# Patient Record
Sex: Male | Born: 1965 | Race: White | Hispanic: No | Marital: Married | Smoking: Never smoker
Health system: Southern US, Community
[De-identification: ages and names within clinical notes are randomized; demographics above are authoritative.]

## PROBLEM LIST (undated history)

## (undated) DIAGNOSIS — L409 Psoriasis, unspecified: Secondary | ICD-10-CM

## (undated) HISTORY — PX: HERNIA REPAIR: SHX51

## (undated) HISTORY — DX: Psoriasis, unspecified: L40.9

---

## 2007-01-03 ENCOUNTER — Ambulatory Visit: Payer: Self-pay | Admitting: Family Medicine

## 2011-03-24 ENCOUNTER — Encounter: Payer: Self-pay | Admitting: Family Medicine

## 2011-03-24 ENCOUNTER — Ambulatory Visit (INDEPENDENT_AMBULATORY_CARE_PROVIDER_SITE_OTHER): Payer: BC Managed Care – PPO | Admitting: Family Medicine

## 2011-03-24 VITALS — BP 123/73 | HR 67 | Temp 98.6°F | Ht 70.0 in | Wt 172.0 lb

## 2011-03-24 DIAGNOSIS — R053 Chronic cough: Secondary | ICD-10-CM

## 2011-03-24 DIAGNOSIS — R05 Cough: Secondary | ICD-10-CM

## 2011-03-24 DIAGNOSIS — J0100 Acute maxillary sinusitis, unspecified: Secondary | ICD-10-CM

## 2011-03-24 MED ORDER — HYDROCOD POLST-CHLORPHEN POLST 10-8 MG/5ML PO LQCR: 5.0000 mL | Freq: Two times a day (BID) | ORAL | Status: DC | PRN

## 2011-03-24 MED ORDER — AMOXICILLIN-POT CLAVULANATE 875-125 MG PO TABS: 1.0000 | ORAL_TABLET | Freq: Two times a day (BID) | ORAL | Status: AC

## 2011-03-24 MED ORDER — FLUTICASONE PROPIONATE 50 MCG/ACT NA SUSP: 2.0000 | Freq: Every day | NASAL | Status: DC

## 2011-03-24 MED ORDER — FEXOFENADINE-PSEUDOEPHED ER 180-240 MG PO TB24: 1.0000 | ORAL_TABLET | Freq: Every day | ORAL | Status: AC

## 2011-03-24 NOTE — Progress Notes (Signed)
Subjective:     Patient ID: Zachary Pearson, male   DOB: [birth year], 46 y.o.   MRN: [id number]  Sinusitis This is a new problem. The current episode started 1 to 4 weeks ago. The problem has been gradually worsening since onset. There has been no fever. Associated symptoms include chills, coughing, diaphoresis, a hoarse voice, sneezing and a sore throat. Pertinent negatives include no congestion, ear pain, headaches, neck pain, shortness of breath, sinus pressure or swollen glands. The treatment provided no relief.  Cough This is a new problem. The current episode started 1 to 4 weeks ago. The problem has been gradually worsening. The problem occurs constantly. The cough is non-productive. Associated symptoms include chills and a sore throat. Pertinent negatives include no ear pain, headaches or shortness of breath. The symptoms are aggravated by lying down. He has tried OTC cough suppressant for the symptoms. The treatment provided no relief. There is no history of asthma, bronchitis, COPD, emphysema or pneumonia.     Review of Systems  Constitutional: Positive for chills and diaphoresis.  HENT: Positive for sore throat, hoarse voice and sneezing. Negative for ear pain, congestion, neck pain and sinus pressure.   Respiratory: Positive for cough. Negative for shortness of breath.   Neurological: Negative for headaches.  All other systems reviewed and are negative.       Objective:   Physical Exam  Nursing note and vitals reviewed. Constitutional: He is oriented to person, place, and time. He appears well-developed and well-nourished. No distress.  HENT:  Head: Normocephalic and atraumatic.  Right Ear: Tympanic membrane, external ear and ear canal normal.  Left Ear: Tympanic membrane, external ear and ear canal normal.  Nose: Mucosal edema and rhinorrhea present. Right sinus exhibits maxillary sinus tenderness. Left sinus exhibits maxillary sinus tenderness.  Mouth/Throat: Oropharynx is clear  and moist. No oral lesions. No oropharyngeal exudate.  Eyes: Right eye exhibits no discharge. Left eye exhibits no discharge. No scleral icterus.  Neck: Neck supple. No tracheal deviation present. No thyromegaly present.  Cardiovascular: Normal rate, regular rhythm and normal heart sounds.   Pulmonary/Chest: Effort normal and breath sounds normal. He has no wheezes. He has no rales.  Lymphadenopathy:    He has no cervical adenopathy.  Neurological: He is alert and oriented to person, place, and time.  Skin: Skin is warm and dry.       Assessment:     Sinusitis     Plan:     Augmentin Allegra D  flonase  tussionex

## 2011-03-24 NOTE — Patient Instructions (Signed)

## 2011-05-06 ENCOUNTER — Encounter: Payer: Self-pay | Admitting: *Deleted

## 2011-05-13 ENCOUNTER — Encounter: Payer: BC Managed Care – PPO | Admitting: Physician Assistant

## 2011-05-13 DIAGNOSIS — Z0289 Encounter for other administrative examinations: Secondary | ICD-10-CM

## 2012-06-08 ENCOUNTER — Ambulatory Visit (INDEPENDENT_AMBULATORY_CARE_PROVIDER_SITE_OTHER): Payer: BC Managed Care – PPO | Admitting: Family Medicine

## 2012-06-08 ENCOUNTER — Encounter: Payer: Self-pay | Admitting: Family Medicine

## 2012-06-08 VITALS — BP 123/77 | HR 86 | Ht 68.5 in | Wt 185.0 lb

## 2012-06-08 DIAGNOSIS — N529 Male erectile dysfunction, unspecified: Secondary | ICD-10-CM | POA: Insufficient documentation

## 2012-06-08 DIAGNOSIS — R5383 Other fatigue: Secondary | ICD-10-CM | POA: Insufficient documentation

## 2012-06-08 DIAGNOSIS — Z131 Encounter for screening for diabetes mellitus: Secondary | ICD-10-CM

## 2012-06-08 DIAGNOSIS — L408 Other psoriasis: Secondary | ICD-10-CM

## 2012-06-08 DIAGNOSIS — L409 Psoriasis, unspecified: Secondary | ICD-10-CM | POA: Insufficient documentation

## 2012-06-08 DIAGNOSIS — R5381 Other malaise: Secondary | ICD-10-CM

## 2012-06-08 DIAGNOSIS — Z13220 Encounter for screening for lipoid disorders: Secondary | ICD-10-CM

## 2012-06-08 HISTORY — DX: Psoriasis, unspecified: L40.9

## 2012-06-08 MED ORDER — SILDENAFIL CITRATE 100 MG PO TABS: 100.0000 mg | ORAL_TABLET | ORAL | Status: DC | PRN

## 2012-06-08 NOTE — Progress Notes (Signed)
CC: Zachary Pearson is a 47 y.o. male is here for would like blood work   Subjective: HPI:  Pleasant 47 year old complains of fatigue. This has been present for about 6 months. Has not been getting better or worse since gradual onset. Described as moderate in severity. States that he wakes up full energy which persists until around 6:00 when he gets home and only wants to do is sit around until he goes to bed at 9:00. He reports getting 6-7 hours of sleep on a nightly basis. Denies fatigue during the day. Denies shortness of breath at rest or with exertion. he has never had this before. No interventions as of yet. Nothing makes it better or worse. He's not quite sure if days that he is not at work are better or not as he works 6-7 days a week.  Patient complains of satisfactory libido however trouble initiating erection. Still has random nocturnal erections. Is in a loving relationship and still attracted to his wife of 9 years. He denies exertional chest pain or limb claudication. He denies dysuria, nor penile discharge.  History psoriasis. He is requesting referral to dermatology Denies fevers, chills, shortness of breath, irregular heartbeat, motor or sensory disturbances, alcohol use, recreational drug use, abdominal pain, decreased appetite, diarrhea, constipation, nausea, nor abdominal pain    Review Of Systems Outlined In HPI  Past Medical History  Diagnosis Date  . Psoriasis 06/08/2012     Family History  Problem Relation Age of Onset  . Cancer Father     lung  . Heart disease Paternal Grandfather      History  Substance Use Topics  . Smoking status: Never Smoker   . Smokeless tobacco: Not on file  . Alcohol Use: No     Objective: Filed Vitals:   06/08/12 1510  BP: 123/77  Pulse: 86    General: Alert and Oriented, No Acute Distress HEENT: Pupils equal, round, reactive to light. Conjunctivae clear.  Moist mucous membranes Lungs: Clear to auscultation bilaterally, no  wheezing/ronchi/rales.  Comfortable work of breathing. Good air movement. Cardiac: Regular rate and rhythm. Normal S1/S2.  No murmurs, rubs, nor gallops.   Abdomen: Soft nontender to palpation Extremities: No peripheral edema.  Strong peripheral pulses.  Mental Status: No depression, anxiety, nor agitation. Skin: Warm and dry. Erythematous and scaly plaques on the left knee and right elbow  Assessment & Plan: Eston was seen today for would like blood work.  Diagnoses and associated orders for this visit:  Fatigue - TSH - B12 - Vitamin D 25 hydroxy - Testosterone, free, total - COMPLETE METABOLIC PANEL WITH GFR - CBC  Lipid screening - Lipid panel  Diabetes mellitus screening - COMPLETE METABOLIC PANEL WITH GFR  Erectile dysfunction - sildenafil (VIAGRA) 100 MG tablet; Take 1 tablet (100 mg total) by mouth as needed for erectile dysfunction. Take 45 minutes prior to activity.  Psoriasis - Ambulatory referral to Dermatology    Fatigue: He will ask his wife about apnea episodes as he is a snorer. Ruling out reversible causes as above, will have this done first thing in the morning to check for testosterone. Lipid screening: He has never been screened for dyslipidemia we will use this blood draw as an opportunity Erectile dysfunction: Samples given of Viagra Psoriasis: Uncontrolled, referral to dermatology   Return in about 3 months (around 09/08/2012).

## 2012-06-25 LAB — CBC
HCT: 43.1 % (ref 39.0–52.0)
Hemoglobin: 15.5 g/dL (ref 13.0–17.0)
MCH: 31.8 pg (ref 26.0–34.0)
MCHC: 36 g/dL (ref 30.0–36.0)
MCV: 88.5 fL (ref 78.0–100.0)

## 2012-06-25 LAB — LIPID PANEL
LDL Cholesterol: 188 mg/dL — ABNORMAL HIGH (ref 0–99)
VLDL: 26 mg/dL (ref 0–40)

## 2012-06-25 LAB — COMPLETE METABOLIC PANEL WITH GFR
ALT: 64 U/L — ABNORMAL HIGH (ref 0–53)
Alkaline Phosphatase: 40 U/L (ref 39–117)
Potassium: 4.5 mEq/L (ref 3.5–5.3)
Sodium: 139 mEq/L (ref 135–145)
Total Bilirubin: 0.6 mg/dL (ref 0.3–1.2)
Total Protein: 7 g/dL (ref 6.0–8.3)

## 2012-06-26 ENCOUNTER — Telehealth: Payer: Self-pay | Admitting: Family Medicine

## 2012-06-26 DIAGNOSIS — E785 Hyperlipidemia, unspecified: Secondary | ICD-10-CM

## 2012-06-26 DIAGNOSIS — R748 Abnormal levels of other serum enzymes: Secondary | ICD-10-CM | POA: Insufficient documentation

## 2012-06-26 LAB — TESTOSTERONE, FREE, TOTAL, SHBG
Sex Hormone Binding: 30 nmol/L (ref 13–71)
Testosterone-% Free: 2.2 % (ref 1.6–2.9)
Testosterone: 436 ng/dL (ref 300–890)

## 2012-06-26 MED ORDER — SIMVASTATIN 20 MG PO TABS: 20.0000 mg | ORAL_TABLET | Freq: Every evening | ORAL | Status: DC

## 2012-06-26 NOTE — Telephone Encounter (Signed)
Zachary Pearson, Will you please let Zachary Pearson know that his blood work did not show any abnormalities with respect to his blood cell counts, thyroid function, vitamin deficiency, nor testosterone deficiency.  I'd still like him to ask his wife to check on him while he sleeps to see if there's any snoring or stopping of breathing as this would suggest the possibility of sleep apnea which is a common cause of fatigue.  His LDL cholesterol was 188 which is well above the upper limit of 160, I would strongly encourage him to start a cholesterol medication called simvastatin which I have sent to his [facility] on file.  Please follow up with me in a few weeks to discus whether or not a sleep study would help rule out sleep apnea causing fatigue.

## 2012-06-27 NOTE — Telephone Encounter (Signed)
Left message with results on pt's vm 

## 2012-08-13 ENCOUNTER — Other Ambulatory Visit: Payer: Self-pay | Admitting: *Deleted

## 2012-08-13 DIAGNOSIS — E785 Hyperlipidemia, unspecified: Secondary | ICD-10-CM

## 2013-10-04 ENCOUNTER — Ambulatory Visit: Payer: BC Managed Care – PPO | Admitting: Sports Medicine

## 2013-10-18 ENCOUNTER — Ambulatory Visit: Payer: BC Managed Care – PPO | Admitting: Family Medicine

## 2013-10-23 ENCOUNTER — Encounter: Payer: Self-pay | Admitting: Family Medicine

## 2013-10-23 ENCOUNTER — Ambulatory Visit (INDEPENDENT_AMBULATORY_CARE_PROVIDER_SITE_OTHER): Payer: Managed Care, Other (non HMO) | Admitting: Family Medicine

## 2013-10-23 VITALS — BP 122/81 | HR 89 | Wt 192.0 lb

## 2013-10-23 DIAGNOSIS — M25569 Pain in unspecified knee: Secondary | ICD-10-CM

## 2013-10-23 DIAGNOSIS — M25561 Pain in right knee: Secondary | ICD-10-CM

## 2013-10-23 MED ORDER — MELOXICAM 15 MG PO TABS: 15.0000 mg | ORAL_TABLET | Freq: Every day | ORAL | Status: DC | PRN

## 2013-10-23 NOTE — Progress Notes (Signed)
CC: Lezlie LyeGeorge Martorana is a 48 y.o. male is here for right knee pain   Subjective: HPI:  Complains of right knee pain that is further described as a cracking sensation and grinding sensation has been present for the past 2 weeks. It is present on a daily basis and most notable and going up or downstairs. Since it occurred he had one episode of right knee pain was described as a sharpness deep in the knee after twisting it however this resolved within minutes and has not returned. He denies locking catching giving way. Denies redness swelling or warmth of the knee and denies recent or remote trauma. Denies fevers, chills, nor weakness in the extremity   Review Of Systems Outlined In HPI  Past Medical History  Diagnosis Date  . Psoriasis 06/08/2012    Past Surgical History  Procedure Laterality Date  . Hernia repair     Family History  Problem Relation Age of Onset  . Cancer Father     lung  . Heart disease Paternal Grandfather     History   Social History  . Marital Status: Married    Spouse Name: N/A    Number of Children: N/A  . Years of Education: N/A   Occupational History  . Not on file.   Social History Main Topics  . Smoking status: Never Smoker   . Smokeless tobacco: Not on file  . Alcohol Use: No  . Drug Use: No  . Sexual Activity: Not on file   Other Topics Concern  . Not on file   Social History Narrative  . No narrative on file     Objective: BP 122/81  Pulse 89  Wt 192 lb (87.091 kg)  Vital signs reviewed. General: Alert and Oriented, No Acute Distress HEENT: Pupils equal, round, reactive to light. Conjunctivae clear.  External ears unremarkable.  Moist mucous membranes. Lungs: Clear and comfortable work of breathing, speaking in full sentences without accessory muscle use. Cardiac: Regular rate and rhythm.  Right knee exam shows full-strength and range of motion. There is no swelling, redness, nor warmth overlying the knee.  No patellar crepitus. No  patellar apprehension. No pain with palpation of the inferior patellar pole.  No pain or laxity with valgus nor varus stress. Anterior drawer is negative. McMurray's negative. No popliteal space tenderness or palpable mass. No medial or lateral joint line tenderness to palpation. Extremities: No peripheral edema.  Strong peripheral pulses.  Mental Status: No depression, anxiety, nor agitation. Logical though process. Skin: Warm and dry.  Assessment & Plan: Greggory StallionGeorge was seen today for right knee pain.  Diagnoses and associated orders for this visit:  Right knee pain - meloxicam (MOBIC) 15 MG tablet; Take 1 tablet (15 mg total) by mouth daily as needed for pain.    Reassurance was provided that if anything he is experiencing mild osteoarthritis and that the auditory component of what is experiencing is benign shifting of synovial fluid. Start home exercise rehabilitation plan on a daily basis the next 3 weeks and as needed meloxicam for pain.   Return if symptoms worsen or fail to improve.

## 2014-10-08 ENCOUNTER — Ambulatory Visit (INDEPENDENT_AMBULATORY_CARE_PROVIDER_SITE_OTHER): Payer: Managed Care, Other (non HMO) | Admitting: Family Medicine

## 2014-10-08 ENCOUNTER — Encounter: Payer: Self-pay | Admitting: Family Medicine

## 2014-10-08 VITALS — BP 116/76 | HR 85 | Ht 70.0 in | Wt 187.0 lb

## 2014-10-08 DIAGNOSIS — L409 Psoriasis, unspecified: Secondary | ICD-10-CM | POA: Diagnosis not present

## 2014-10-08 DIAGNOSIS — Z79899 Other long term (current) drug therapy: Secondary | ICD-10-CM

## 2014-10-08 DIAGNOSIS — Z111 Encounter for screening for respiratory tuberculosis: Secondary | ICD-10-CM

## 2014-10-08 DIAGNOSIS — Z23 Encounter for immunization: Secondary | ICD-10-CM | POA: Diagnosis not present

## 2014-10-08 DIAGNOSIS — Z0000 Encounter for general adult medical examination without abnormal findings: Secondary | ICD-10-CM

## 2014-10-08 DIAGNOSIS — N529 Male erectile dysfunction, unspecified: Secondary | ICD-10-CM

## 2014-10-08 MED ORDER — TADALAFIL 20 MG PO TABS: 10.0000 mg | ORAL_TABLET | ORAL | Status: DC | PRN

## 2014-10-08 NOTE — Addendum Note (Signed)
Addended by: Laren Boom on: 10/08/2014 04:46 PM   Modules accepted: Orders

## 2014-10-08 NOTE — Progress Notes (Signed)
Requests samples for cialis

## 2014-10-08 NOTE — Addendum Note (Signed)
Addended by: Wyline Beady on: 10/08/2014 04:49 PM   Modules accepted: Orders

## 2014-10-08 NOTE — Progress Notes (Signed)
CC: Zachary Pearson is a 49 y.o. male is here for Annual Exam   Subjective: HPI:  Colonoscopy: No current indication Prostate: Discussed screening risks/beneifts with patient today, no current indication until 50   Influenza Vaccine: Up-to-date Pneumovax: No current indication Td/Tdap: Overdue for tetanus booster Zoster: (Start 47 yo)  Presents for complete physical exam requesting guidance on starting medication for psoriasis. He is interested in injectable medication such as Humira. He heard that he might need to be tested for some infections.  Review of Systems - General ROS: negative for - chills, fever, night sweats, weight gain or weight loss Ophthalmic ROS: negative for - decreased vision Psychological ROS: negative for - anxiety or depression ENT ROS: negative for - hearing change, nasal congestion, tinnitus or allergies Hematological and Lymphatic ROS: negative for - bleeding problems, bruising or swollen lymph nodes Breast ROS: negative Respiratory ROS: no cough, shortness of breath, or wheezing Cardiovascular ROS: no chest pain or dyspnea on exertion Gastrointestinal ROS: no abdominal pain, change in bowel habits, or black or bloody stools Genito-Urinary ROS: negative for - genital discharge, genital ulcers, incontinence or abnormal bleeding from genitals Musculoskeletal ROS: negative for - joint pain or muscle pain Neurological ROS: negative for - headaches or memory loss Dermatological ROS: negative for lumps, mole changes, rash and skin lesion changes other than psoriasis plaques involving the chin buttocks back thighs  Past Medical History  Diagnosis Date  . Psoriasis 06/08/2012    Past Surgical History  Procedure Laterality Date  . Hernia repair     Family History  Problem Relation Age of Onset  . Cancer Father     lung  . Heart disease Paternal Grandfather     History   Social History  . Marital Status: Married    Spouse Name: N/A  . Number of Children:  N/A  . Years of Education: N/A   Occupational History  . Not on file.   Social History Main Topics  . Smoking status: Never Smoker   . Smokeless tobacco: Not on file  . Alcohol Use: No  . Drug Use: No  . Sexual Activity: Not on file   Other Topics Concern  . Not on file   Social History Narrative     Objective: BP 116/76 mmHg  Pulse 85  Ht  (1.778 m)  Wt 187 lb (84.823 kg)  BMI 26.83 kg/m2  General: No Acute Distress HEENT: Atraumatic, normocephalic, conjunctivae normal without scleral icterus.  No nasal discharge, hearing grossly intact, TMs with good landmarks bilaterally with no middle ear abnormalities, posterior pharynx clear without oral lesions. Neck: Supple, trachea midline, no cervical nor supraclavicular adenopathy. Pulmonary: Clear to auscultation bilaterally without wheezing, rhonchi, nor rales. Cardiac: Regular rate and rhythm.  No murmurs, rubs, nor gallops. No peripheral edema.  2+ peripheral pulses bilaterally. Abdomen: Bowel sounds normal.  No masses.  Non-tender without rebound.  Negative Murphy's sign. MSK: Grossly intact, no signs of weakness.  Full strength throughout upper and lower extremities.  Full ROM in upper and lower extremities.  No midline spinal tenderness. Neuro: Gait unremarkable, CN II-XII grossly intact.  C5-C6 Reflex 2/4 Bilaterally, L4 Reflex 2/4 Bilaterally.  Cerebellar function intact. Skin: Mild psoriasis plaques on the back, upper buttocks, right ear, left chin Psych: Alert and oriented to person/place/time.  Thought process normal. No anxiety/depression. Assessment & Plan: Zachary Pearson was seen today for annual exam.  Diagnoses and all orders for this visit:  Annual physical exam Orders: -  Lipid panel -     CBC -     COMPLETE METABOLIC PANEL WITH GFR  High risk medication use Orders: -     Hepatitis B surface antigen  Psoriasis Orders: -     Ambulatory referral to Dermatology   Healthy lifestyle interventions  including but not limited to regular exercise, a healthy low fat diet, moderation of salt intake, the dangers of tobacco/alcohol/recreational drug use, nutrition supplementation, and accident avoidance were discussed with the patient and a handout was provided for future reference.  He is interested in Biologics for psoriasis, offered PPD placement today along with hepatitis B surface antigen testing to evaluate his candidacy for something like Humira. I also referred him to dermatology and I will make sure that he has records of these results that he can take with him to his upcoming pathology referral.  Advised to have a nurse visit in 2-3 days to have his PPD read.  Return in about 1 year (around 10/08/2015).

## 2015-04-16 ENCOUNTER — Ambulatory Visit (HOSPITAL_BASED_OUTPATIENT_CLINIC_OR_DEPARTMENT_OTHER)
Admission: RE | Admit: 2015-04-16 | Discharge: 2015-04-16 | Disposition: A | Discharge status: Home / Self Care | Payer: Managed Care, Other (non HMO) | Source: Ambulatory Visit | Attending: Family Medicine | Admitting: Family Medicine

## 2015-04-16 ENCOUNTER — Encounter: Payer: Self-pay | Admitting: Family Medicine

## 2015-04-16 ENCOUNTER — Ambulatory Visit (INDEPENDENT_AMBULATORY_CARE_PROVIDER_SITE_OTHER): Payer: Managed Care, Other (non HMO) | Admitting: Family Medicine

## 2015-04-16 VITALS — BP 124/79 | HR 76 | Wt 191.0 lb

## 2015-04-16 DIAGNOSIS — R1904 Left lower quadrant abdominal swelling, mass and lump: Secondary | ICD-10-CM | POA: Insufficient documentation

## 2015-04-16 DIAGNOSIS — R1032 Left lower quadrant pain: Secondary | ICD-10-CM | POA: Diagnosis not present

## 2015-04-16 DIAGNOSIS — N529 Male erectile dysfunction, unspecified: Secondary | ICD-10-CM

## 2015-04-16 DIAGNOSIS — K4020 Bilateral inguinal hernia, without obstruction or gangrene, not specified as recurrent: Secondary | ICD-10-CM | POA: Insufficient documentation

## 2015-04-16 LAB — BASIC METABOLIC PANEL WITH GFR
BUN: 12 mg/dL (ref 7–25)
CALCIUM: 9.6 mg/dL (ref 8.6–10.3)
CO2: 31 mmol/L (ref 20–31)
Chloride: 102 mmol/L (ref 98–110)
Creat: 0.99 mg/dL (ref 0.60–1.35)
GFR, Est Non African American: >89 mL/min (ref >=60)
Glucose, Bld: 85 mg/dL (ref 65–99)
Potassium: 4 mmol/L (ref 3.5–5.3)
Sodium: 138 mmol/L (ref 135–146)

## 2015-04-16 MED ORDER — IOHEXOL 300 MG/ML  SOLN
100.0000 mL | Freq: Once | INTRAMUSCULAR | Status: AC | PRN
  Administered 2015-04-16: 100 mL via INTRAVENOUS

## 2015-04-16 MED ORDER — SILDENAFIL CITRATE 100 MG PO TABS: 100.0000 mg | ORAL_TABLET | Freq: Every day | ORAL | Status: DC | PRN

## 2015-04-16 MED ORDER — IOHEXOL 300 MG/ML  SOLN
50.0000 mL | Freq: Once | INTRAMUSCULAR | Status: AC | PRN
  Administered 2015-04-16: 50 mL via ORAL

## 2015-04-16 NOTE — Progress Notes (Signed)
CC: Zachary Pearson is a 50 y.o. male is here for Hiatal Hernia   Subjective: HPI:  Left lower quadrant fullness and mass noticed yesterday morning. It has not been getting larger or smaller since onset and pain has not gotten better or worse. He's never had this before. He had some form of hernia operation when he was an infant but he doesn't know the specifics. He denies any other abdominal pain or surgeries. Denies nausea, vomiting, diarrhea, constipation or change in appetite. Denies fevers or chills since her mild in severity   Review Of Systems Outlined In HPI  Past Medical History  Diagnosis Date  . Psoriasis 06/08/2012    Past Surgical History  Procedure Laterality Date  . Hernia repair     Family History  Problem Relation Age of Onset  . Cancer Father     lung  . Heart disease Paternal Grandfather     Social History   Social History  . Marital Status: Married    Spouse Name: N/A  . Number of Children: N/A  . Years of Education: N/A   Occupational History  . Not on file.   Social History Main Topics  . Smoking status: Never Smoker   . Smokeless tobacco: Not on file  . Alcohol Use: No  . Drug Use: No  . Sexual Activity: Not on file   Other Topics Concern  . Not on file   Social History Narrative     Objective: BP 124/79 mmHg  Pulse 76  Wt 191 lb (86.637 kg)  General: Alert and Oriented, No Acute Distress HEENT: Pupils equal, round, reactive to light. Conjunctivae clear.  Lungs: Clear to auscultation bilaterally, no wheezing/ronchi/rales.  Comfortable work of breathing. Good air movement. Cardiac: Regular rate and rhythm. Normal S1/S2.  No murmurs, rubs, nor gallops.   Abdomen: Normal bowel sounds, there is a soft tender mass about the size of an egg in the left lower quadrant which is not reducible. Extremities: No peripheral edema.  Strong peripheral pulses.  Mental Status: No depression, anxiety, nor agitation. Skin: Warm and dry.  Assessment &  Plan: Hagan was seen today for hiatal hernia.  Diagnoses and all orders for this visit:  LLQ pain -     CT Abdomen Pelvis W Contrast; Future -     BASIC METABOLIC PANEL WITH GFR  LLQ abdominal mass -     CT Abdomen Pelvis W Contrast; Future -     BASIC METABOLIC PANEL WITH GFR  Erectile dysfunction, unspecified erectile dysfunction type -     sildenafil (VIAGRA) 100 MG tablet; Take 1 tablet (100 mg total) by mouth daily as needed for erectile dysfunction.   Left lower quadrant pain and mass concerning for hernia, obtaining CT with contrast for further evaluation. Discussed signs and symptoms of incarceration of a hernia that would require emergent evaluation and treatment.  He would like to switch from Cialis to Viagra for rectal dysfunction which seems reasonable. Encouraged to avoid strenuous activity until we have a conclusion on what's causing his abdominal mass.  Return if symptoms worsen or fail to improve.

## 2015-04-23 ENCOUNTER — Other Ambulatory Visit: Payer: Self-pay

## 2015-04-23 DIAGNOSIS — N529 Male erectile dysfunction, unspecified: Secondary | ICD-10-CM

## 2015-04-23 MED ORDER — SILDENAFIL CITRATE 100 MG PO TABS: 100.0000 mg | ORAL_TABLET | Freq: Every day | ORAL | Status: AC | PRN

## 2015-09-21 ENCOUNTER — Ambulatory Visit (INDEPENDENT_AMBULATORY_CARE_PROVIDER_SITE_OTHER): Payer: 59 | Admitting: Family Medicine

## 2015-09-21 ENCOUNTER — Encounter: Payer: Self-pay | Admitting: Family Medicine

## 2015-09-21 VITALS — BP 100/65 | HR 94 | Wt 167.0 lb

## 2015-09-21 DIAGNOSIS — R0683 Snoring: Secondary | ICD-10-CM

## 2015-09-21 DIAGNOSIS — G478 Other sleep disorders: Secondary | ICD-10-CM

## 2015-09-21 DIAGNOSIS — I255 Ischemic cardiomyopathy: Secondary | ICD-10-CM

## 2015-09-21 DIAGNOSIS — I469 Cardiac arrest, cause unspecified: Secondary | ICD-10-CM | POA: Diagnosis not present

## 2015-09-21 NOTE — Progress Notes (Signed)
CC: Zachary Pearson is a 50 y.o. male is here for Hospitalization Follow-up and Referral   Subjective: HPI:   Since I saw patient last had a cardiac arrest in June and when EMS arrived he was in V. T[place], he tells me he was put in a coma for about 7 days. The attending cardiologist raised the suspicion for sleep apnea while he was trying to be weaned off of the ventilator. He tells me that for the past couple of years he's always been told that he snores at night. He occasionally feels like he gets nonrestorative sleep. He can nap in the late afternoon without any difficulty. His wife tells me that she watches him in his sleep and he stops breathing frequently. No family history of sleep apnea. He's never been tested for this in the past. He does not drink alcohol excessively nor she a smoker.   Review Of Systems Outlined In HPI  Past Medical History  Diagnosis Date  . Psoriasis 06/08/2012    Past Surgical History  Procedure Laterality Date  . Hernia repair     Family History  Problem Relation Age of Onset  . Cancer Father     lung  . Heart disease Paternal Grandfather     Social History   Social History  . Marital Status: Married    Spouse Name: N/A  . Number of Children: N/A  . Years of Education: N/A   Occupational History  . Not on file.   Social History Main Topics  . Smoking status: Never Smoker   . Smokeless tobacco: Not on file  . Alcohol Use: No  . Drug Use: No  . Sexual Activity: Not on file   Other Topics Concern  . Not on file   Social History Narrative     Objective: BP 100/65 mmHg  Pulse 94  Wt 167 lb (75.751 kg)  General: Alert and Oriented, No Acute Distress HEENT: Pupils equal, round, reactive to light. Conjunctivae clear.  Moist mucous membranes Lungs: Clear to auscultation bilaterally, no wheezing/ronchi/rales.  Comfortable work of breathing. Good air movement. Cardiac: Regular rate and rhythm. Normal S1/S2.  No murmurs, rubs, nor gallops.    Extremities: No peripheral edema.  Strong peripheral pulses.  Mental Status: No depression, anxiety, nor agitation. Skin: Warm and dry.  Assessment & Plan: Zachary Pearson was seen today for hospitalization follow-up and referral.  Diagnoses and all orders for this visit:  Cardiomyopathy, ischemic -     Nocturnal polysomnography (NPSG); Future  Cardiac arrest (HCC) -     Nocturnal polysomnography (NPSG); Future  Non-restorative sleep -     Nocturnal polysomnography (NPSG); Future  Snoring -     Nocturnal polysomnography (NPSG); Future   Nonrestorative sleep and snoring: Given his recent ischemic cardiomyopathy with cardiac arrest seems reasonable to get a sleep study however I think it would be best to have this done in the sleep lab instead of at home. Referral placed to [place] [facility] per patient request of staying in [place].   Return if symptoms worsen or fail to improve.

## 2015-10-08 ENCOUNTER — Telehealth: Payer: Self-pay | Admitting: Family Medicine

## 2015-10-08 DIAGNOSIS — G478 Other sleep disorders: Secondary | ICD-10-CM | POA: Insufficient documentation

## 2015-10-08 MED ORDER — GABAPENTIN 100 MG PO CAPS: 100.0000 mg | ORAL_CAPSULE | Freq: Every day | ORAL | 0 refills | Status: DC

## 2015-10-08 NOTE — Telephone Encounter (Signed)
Left vm for pt to return call to clinic  

## 2015-10-08 NOTE — Telephone Encounter (Signed)
Will you please let patient know that he technically has sleep apnea but it's not bad enough to require any treatment at this time.  The sleep doctor who interpreted the sleep study thinks that his non-restorative sleep is due to frequent awakenings and recommended trying a sleep medication called gabapentin. I'll send in this Rx to his [facility]. Please f/u with me in 2-4 weeks.

## 2015-10-09 ENCOUNTER — Other Ambulatory Visit: Payer: Self-pay

## 2015-10-09 MED ORDER — GABAPENTIN 100 MG PO CAPS: 100.0000 mg | ORAL_CAPSULE | Freq: Every day | ORAL | 0 refills | Status: DC

## 2015-10-09 NOTE — Telephone Encounter (Signed)
Pt notified and Rx will be sent to [facility] on [address]

## 2015-10-22 ENCOUNTER — Telehealth: Payer: Self-pay

## 2015-10-22 ENCOUNTER — Encounter: Payer: Self-pay | Admitting: Family Medicine

## 2015-10-22 ENCOUNTER — Ambulatory Visit (INDEPENDENT_AMBULATORY_CARE_PROVIDER_SITE_OTHER): Payer: 59 | Admitting: Family Medicine

## 2015-10-22 DIAGNOSIS — G4733 Obstructive sleep apnea (adult) (pediatric): Secondary | ICD-10-CM | POA: Insufficient documentation

## 2015-10-22 NOTE — Progress Notes (Signed)
CC: Zachary Pearson is a 50 y.o. male is here for SLEEP STUDY F/U   Subjective: HPI:  Follow-up sleep apnea: He decided not to start taking gabapentin. He found out that it is a "nerve pill"and doesn't like the idea of something interfering with his central nervous system. He denies nonrestorative sleep. He denies difficulty staying awake, he's never fallen asleep behind the wheel.   Review Of Systems Outlined In HPI  Past Medical History:  Diagnosis Date  . Psoriasis 06/08/2012    Past Surgical History:  Procedure Laterality Date  . HERNIA REPAIR     Family History  Problem Relation Age of Onset  . Cancer Father     lung  . Heart disease Paternal Grandfather     Social History   Social History  . Marital status: Married    Spouse name: N/A  . Number of children: N/A  . Years of education: N/A   Occupational History  . Not on file.   Social History Main Topics  . Smoking status: Never Smoker  . Smokeless tobacco: Not on file  . Alcohol use No  . Drug use: No  . Sexual activity: Not on file   Other Topics Concern  . Not on file   Social History Narrative  . No narrative on file     Objective: BP (!) 92/57   Pulse 87   Wt 170 lb (77.1 kg)   BMI 24.39 kg/m   Vital signs reviewed. General: Alert and Oriented, No Acute Distress HEENT: Pupils equal, round, reactive to light. Conjunctivae clear.  External ears unremarkable.  Moist mucous membranes. Lungs: Clear and comfortable work of breathing, speaking in full sentences without accessory muscle use. Cardiac: Regular rate and rhythm. No murmurs rubs or gallops Neuro: CN II-XII grossly intact, gait normal. Extremities: No peripheral edema.  Strong peripheral pulses.  Mental Status: No depression, anxiety, nor agitation. Logical though process. Skin: Warm and dry. Assessment & Plan: Zachary Pearson was seen today for sleep study f/u.  Diagnoses and all orders for this visit:  OSA (obstructive sleep apnea)   I  respect his decision not to take gabapentin, we discussed how weight loss could help reduce his mild OSA. Also went over symptoms that would suggest the need to repeat a sleep study in the future in case his severity worsens.  Discussed with this patient that I will be resigning from my position here with [facility] in September in order to stay with my family [place] be [place]. I let him know about the providers that are still accepting patients and I feel that this individual will be under great care if he/she stays here with [facility].  Return in about 3 months (around 01/22/2016) for Dr. Denyse Amassorey Visit.

## 2015-10-23 ENCOUNTER — Telehealth: Payer: Self-pay | Admitting: Family Medicine

## 2015-10-23 NOTE — Telephone Encounter (Signed)
Dr. Ivan Pearson [name] came in today and stated you gave him a release to work note yesterday but his company says this has to be on our [employer] letterhead. He asked if you could please write the release on letterhead and have it ready by the end of the day. I did tell him that I would send you a message and you were in clinic so I was not sure when it would be ready.  He would also like a phone call when it is ready to pick up. - CF

## 2015-10-23 NOTE — Telephone Encounter (Signed)
Pt.notified

## 2015-10-23 NOTE — Telephone Encounter (Signed)
Placed in your in box. 

## 2016-01-13 DEATH — deceased

## 2016-01-22 ENCOUNTER — Ambulatory Visit: Payer: 59 | Admitting: Family Medicine

## 2016-09-12 IMAGING — CT CT ABD-PELV W/ CM
2 of 5 series · 16 of 46 positions shown, 18 images · IV contrast (APPLIED)
Comparison: None

CLINICAL DATA: Painless bulge in the left abdomen

EXAM:
CT ABDOMEN AND PELVIS WITH CONTRAST
TECHNIQUE: Multidetector CT imaging of the abdomen and pelvis was performed
using the standard protocol following bolus administration of
intravenous contrast.
CONTRAST:  50mL OMNIPAQUE IOHEXOL 300 MG/ML SOLN, 100mL OMNIPAQUE
IOHEXOL 300 MG/ML SOLN

[Series 2: axial st · axial · 0.81mm/px · z∈[-458,-18]mm · 13 of 100 slices shown, 15 images]
[im 6/100  soft-tissue]
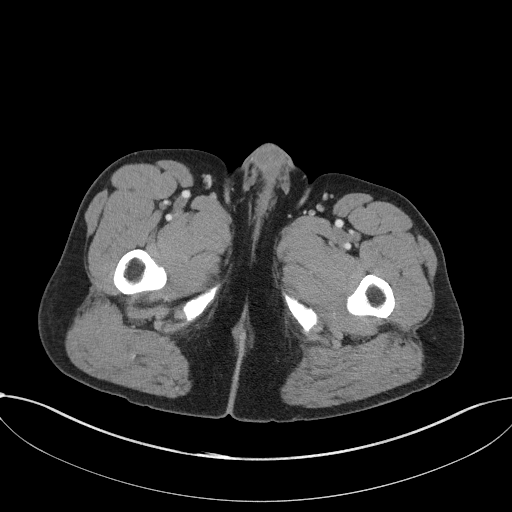
[im 6/100  bone]
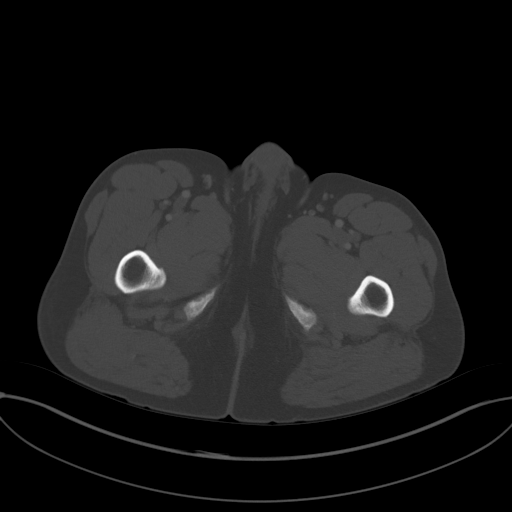
[im 16/100  soft-tissue]
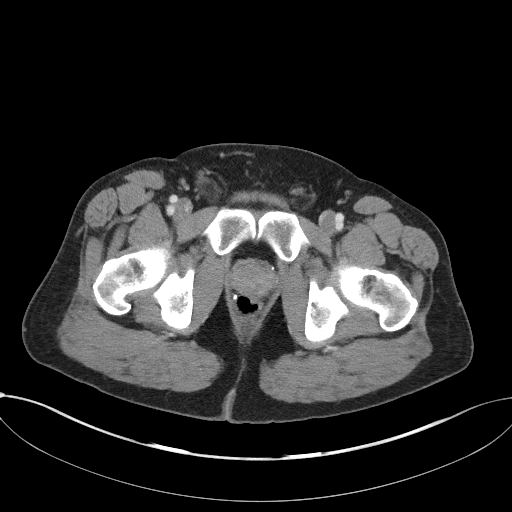
[im 21/100  soft-tissue]
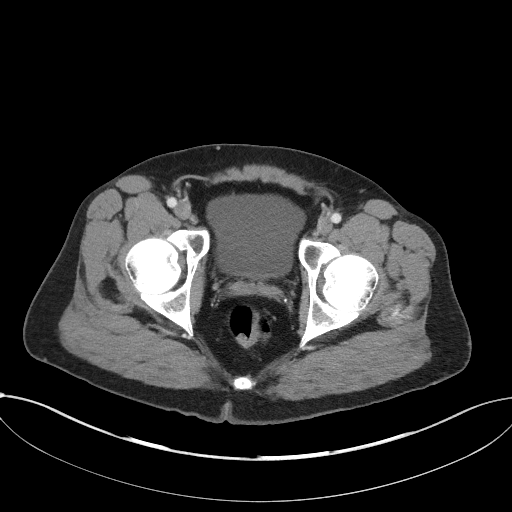
[im 27/100  soft-tissue]
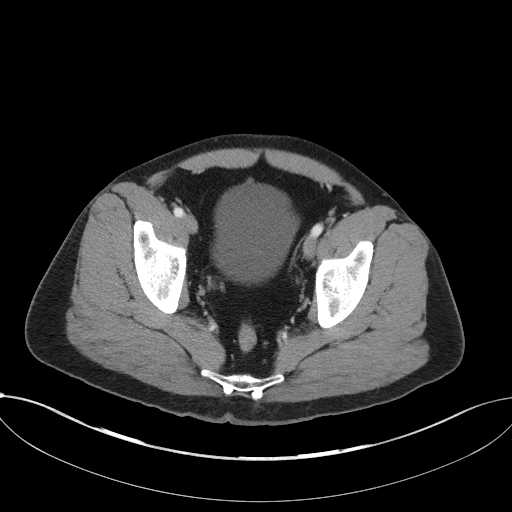
[im 37/100  soft-tissue]
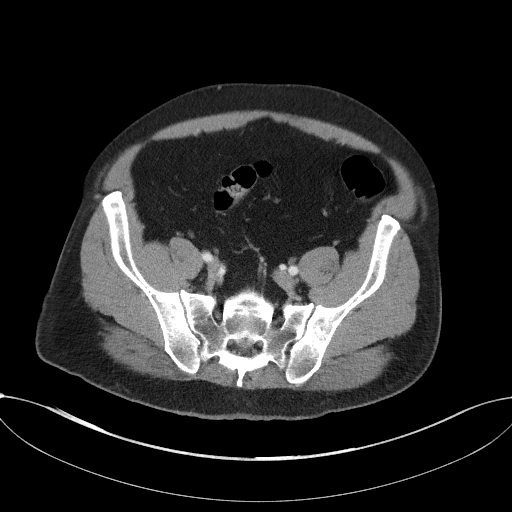
[im 42/100  soft-tissue]
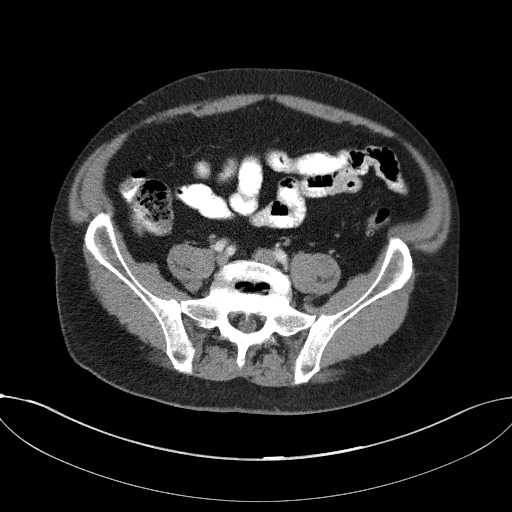
[im 53/100  soft-tissue]
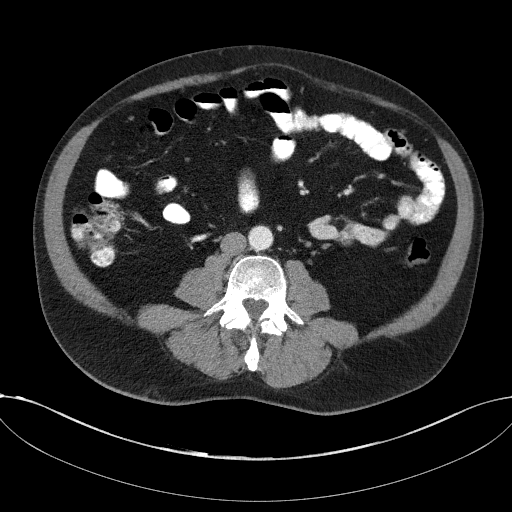
[im 58/100  soft-tissue]
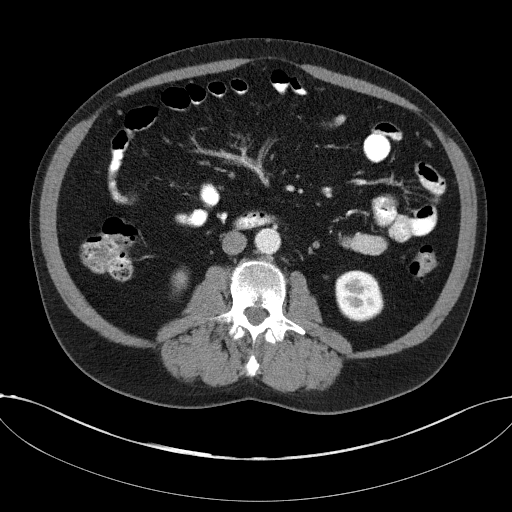
[im 63/100  soft-tissue]
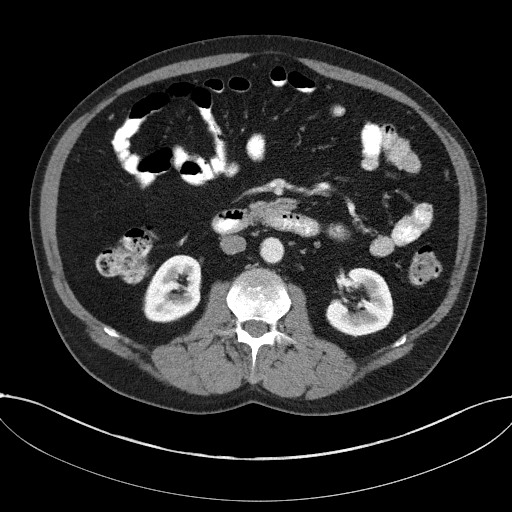
[im 63/100  bone]
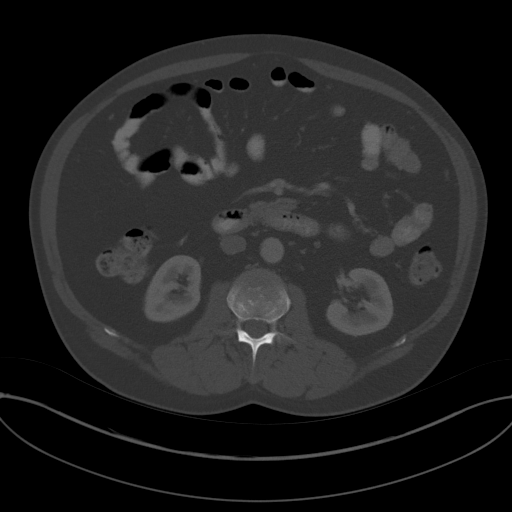
[im 73/100  soft-tissue]
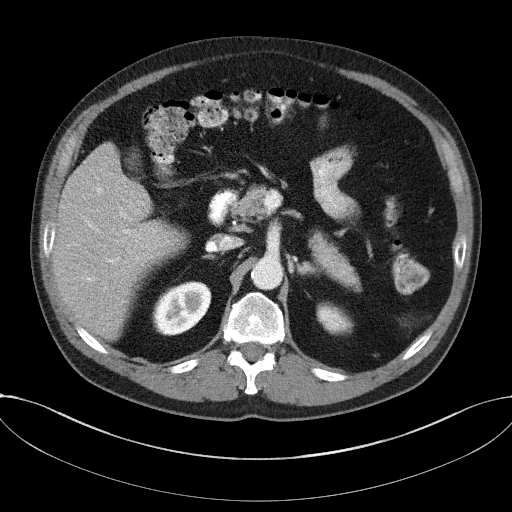
[im 79/100  soft-tissue]
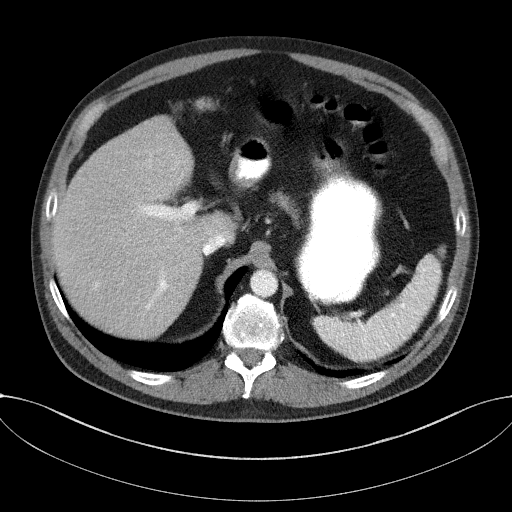
[im 84/100  soft-tissue]
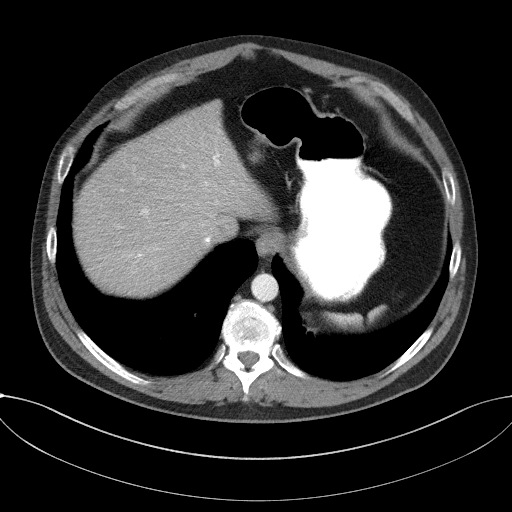
[im 94/100  soft-tissue]
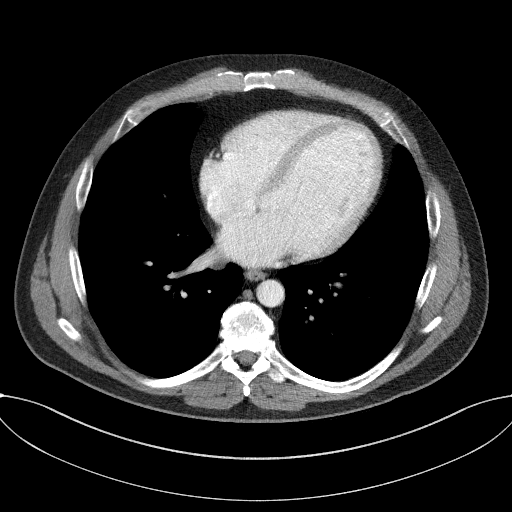

[Series 5: coronal st · coronal · 0.76mm/px · 3 of 98 slices shown]
[im 33/98  soft-tissue]
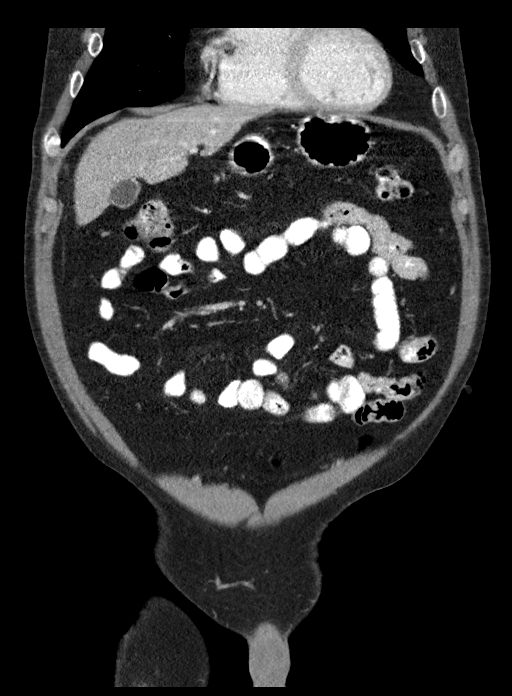
[im 44/98  soft-tissue]
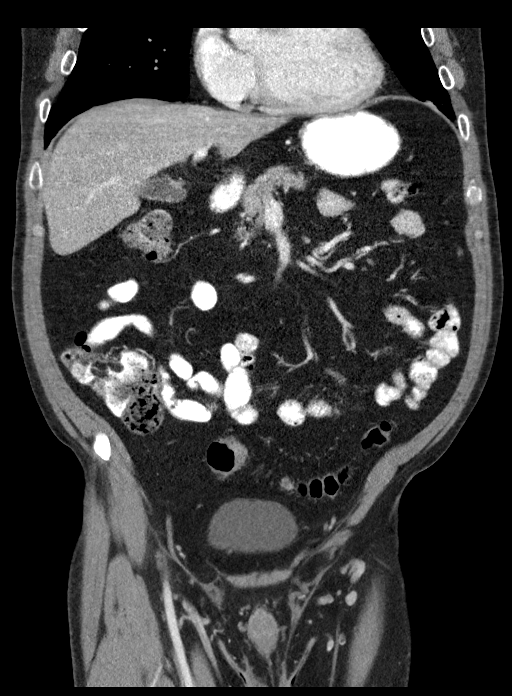
[im 54/98  soft-tissue]
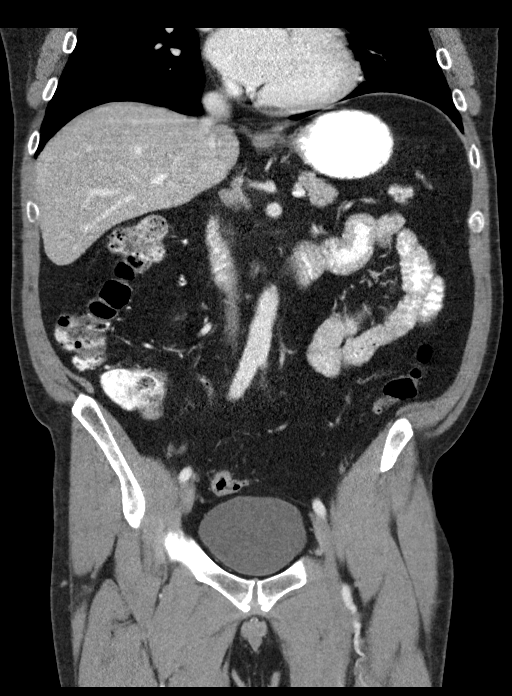

[16 of 46 positions shown; findings below may reference images not displayed]

FINDINGS: Lower chest: The lung bases are clear no pleural or pericardial
effusion noted.

Hepatobiliary: The liver appears normal. Normal appearance of the
gallbladder. No biliary dilatation.

Pancreas: Negative.

Spleen: The spleen is negative.

Adrenals/Urinary Tract: Normal appearance of the adrenal glands. The
kidneys are unremarkable. No obstructive uropathy. The urinary
bladder appears normal.

Stomach/Bowel: The stomach is normal. The small bowel loops have a
normal course and caliber. No obstruction. No pathologic dilatation
of the colon. The appendix is visualized and appears normal. Distal
colonic diverticula identified. No acute inflammation.

Vascular/Lymphatic: Normal appearance of the abdominal aorta. No
enlarged retroperitoneal or mesenteric adenopathy. No enlarged
pelvic or inguinal lymph nodes.

Reproductive: The prostate gland and seminal vesicles appear normal.

Other: No free fluid or fluid collections within the abdomen or
pelvis. A fiducial marker was placed over the area of concern within
the left lower quadrant ventral abdominal wall. No underlying mass
or hernia identified. There are small bilateral fat containing
inguinal hernias identified.

Musculoskeletal: Degenerative disc disease is identified within the
lumbar spine. This is most advanced at the L5-S1 level. Bilateral L5
pars defects are noted.
IMPRESSION: Pole

1. No acute findings identified within the abdomen or pelvis. No
abnormality identified to explain bulge and left abdomen.
2. Small bilateral inguinal hernias which contain fat only.
